# Patient Record
Sex: Female | Born: 1980 | Race: White | Hispanic: No
Health system: Southern US, Community
[De-identification: ages and names within clinical notes are randomized; demographics above are authoritative.]

---

## 2005-07-15 ENCOUNTER — Other Ambulatory Visit: Admission: RE | Admit: 2005-07-15 | Discharge: 2005-07-15 | Payer: Self-pay | Admitting: Obstetrics and Gynecology

## 2006-03-29 ENCOUNTER — Other Ambulatory Visit: Admission: RE | Admit: 2006-03-29 | Discharge: 2006-03-29 | Payer: Self-pay | Admitting: Obstetrics & Gynecology

## 2006-05-20 ENCOUNTER — Other Ambulatory Visit: Admission: RE | Admit: 2006-05-20 | Discharge: 2006-05-20 | Payer: Self-pay | Admitting: Obstetrics & Gynecology

## 2009-02-25 ENCOUNTER — Emergency Department (HOSPITAL_COMMUNITY): Admission: EM | Admit: 2009-02-25 | Discharge: 2009-02-25 | Payer: Self-pay | Admitting: Emergency Medicine

## 2009-11-30 ENCOUNTER — Emergency Department (HOSPITAL_COMMUNITY): Admission: EM | Admit: 2009-11-30 | Discharge: 2009-11-30 | Payer: Self-pay | Admitting: Emergency Medicine

## 2011-01-13 LAB — URINALYSIS, ROUTINE W REFLEX MICROSCOPIC
Bilirubin Urine: NEGATIVE
Ketones, ur: NEGATIVE mg/dL
Specific Gravity, Urine: 1.016 (ref 1.005–1.030)
Urobilinogen, UA: 2 mg/dL — ABNORMAL HIGH (ref 0.0–1.0)
pH: 7.5 (ref 5.0–8.0)

## 2011-01-13 LAB — COMPREHENSIVE METABOLIC PANEL
ALT: 258 U/L — ABNORMAL HIGH (ref 0–35)
Albumin: 4.1 g/dL (ref 3.5–5.2)
Alkaline Phosphatase: 101 U/L (ref 39–117)
Calcium: 9.9 mg/dL (ref 8.4–10.5)
Chloride: 103 mEq/L (ref 96–112)
Creatinine, Ser: 0.7 mg/dL (ref 0.4–1.2)
Potassium: 3.8 mEq/L (ref 3.5–5.1)
Total Protein: 7.4 g/dL (ref 6.0–8.3)

## 2011-01-13 LAB — CBC
Hemoglobin: 14.5 g/dL (ref 12.0–15.0)
MCHC: 34.5 g/dL (ref 30.0–36.0)
Platelets: 259 10*3/uL (ref 150–400)
RBC: 4.7 MIL/uL (ref 3.87–5.11)
WBC: 11.6 10*3/uL — ABNORMAL HIGH (ref 4.0–10.5)

## 2011-01-13 LAB — DIFFERENTIAL
Basophils Absolute: 0.1 10*3/uL (ref 0.0–0.1)
Monocytes Relative: 7 % (ref 3–12)

## 2011-02-02 LAB — CBC
Hemoglobin: 14.9 g/dL (ref 12.0–15.0)
MCHC: 35.2 g/dL (ref 30.0–36.0)
MCV: 87.6 fL (ref 78.0–100.0)
RDW: 13.2 % (ref 11.5–15.5)

## 2011-02-02 LAB — URINALYSIS, ROUTINE W REFLEX MICROSCOPIC
Bilirubin Urine: NEGATIVE
Ketones, ur: NEGATIVE mg/dL
Nitrite: NEGATIVE
Urobilinogen, UA: 1 mg/dL (ref 0.0–1.0)

## 2011-02-02 LAB — DIFFERENTIAL
Eosinophils Relative: 1 % (ref 0–5)
Lymphocytes Relative: 27 % (ref 12–46)
Lymphs Abs: 2.5 10*3/uL (ref 0.7–4.0)
Monocytes Relative: 5 % (ref 3–12)
Neutro Abs: 5.9 10*3/uL (ref 1.7–7.7)
Neutrophils Relative %: 66 % (ref 43–77)

## 2011-02-02 LAB — POCT I-STAT, CHEM 8
Chloride: 109 mEq/L (ref 96–112)
Hemoglobin: 14.6 g/dL (ref 12.0–15.0)
Potassium: 3.9 mEq/L (ref 3.5–5.1)
Sodium: 140 mEq/L (ref 135–145)

## 2011-09-19 IMAGING — US US ABDOMEN COMPLETE
1 series · 14 of 25 positions shown · non-contrast
Comparison: None.

CLINICAL DATA: Abdominal pain and nausea

COMPLETE ABDOMINAL ULTRASOUND

[Series 1: us abdomen complete · 0.32mm/px · 14 of 57 slices shown]
[im 1/57]
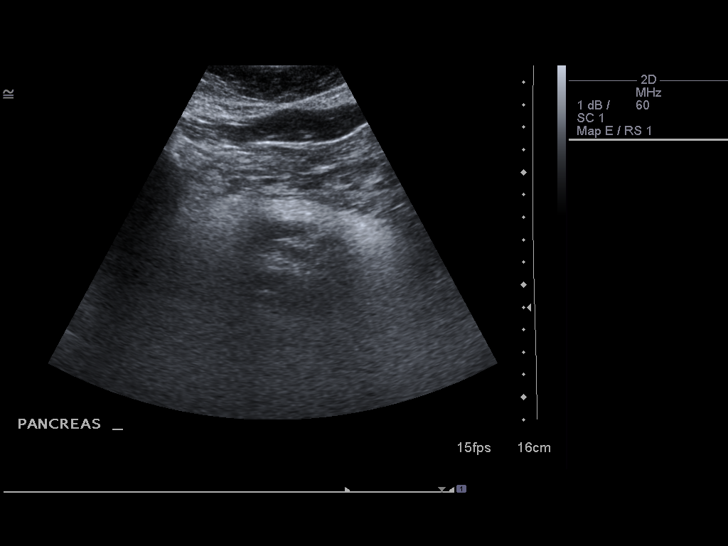
[im 5/57]
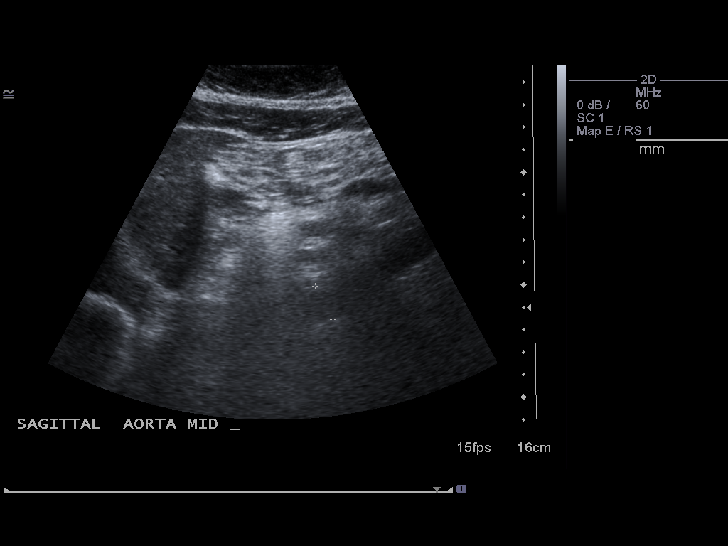
[im 10/57]
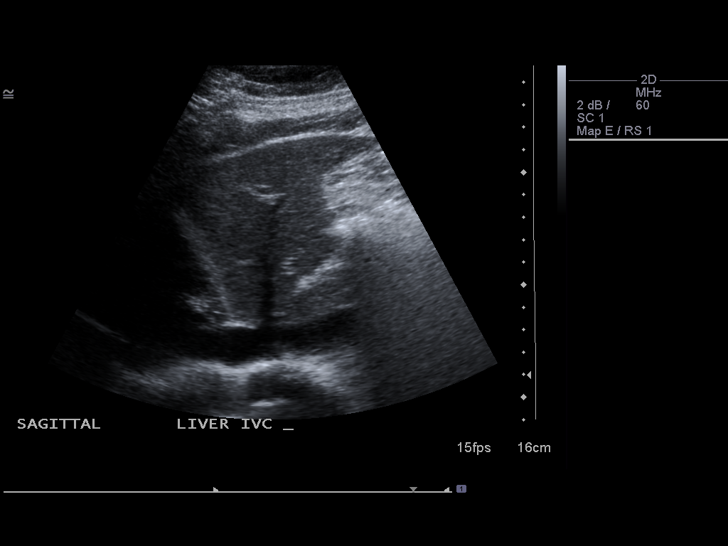
[im 15/57]
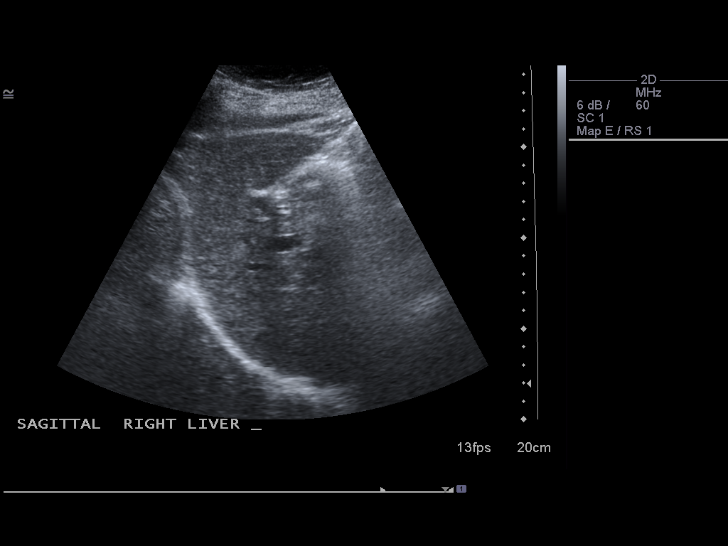
[im 19/57]
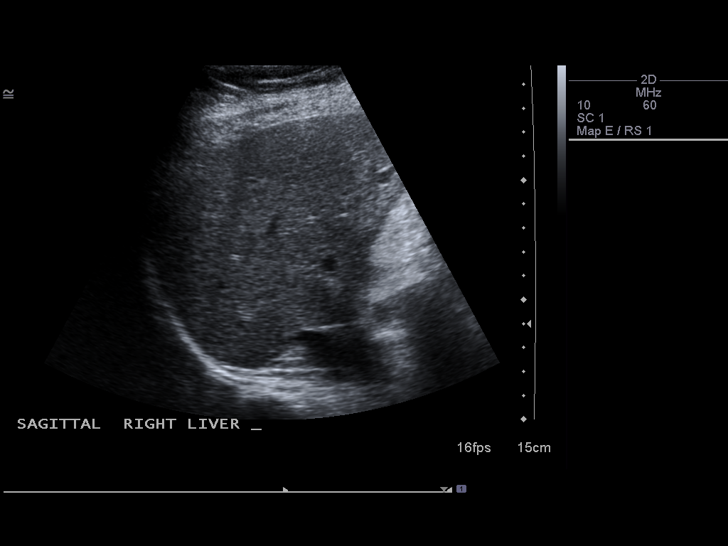
[im 22/57]
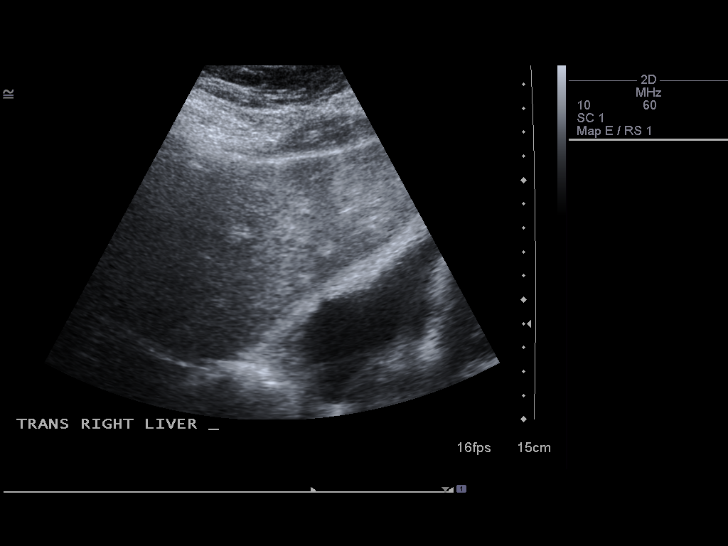
[im 26/57]
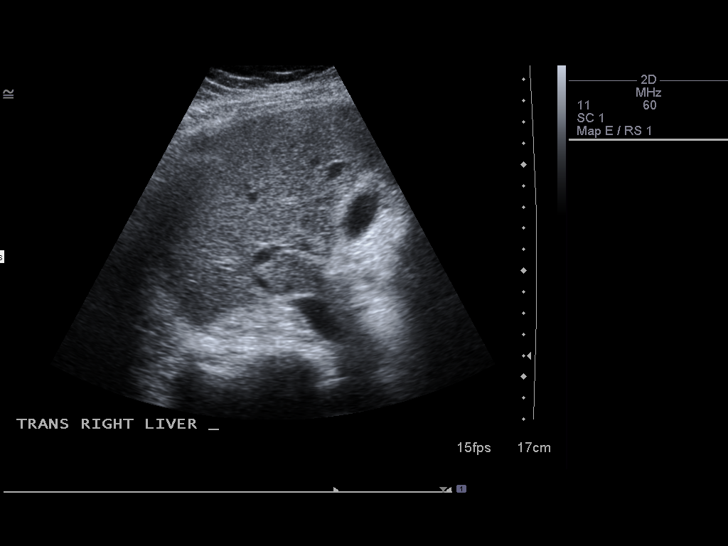
[im 31/57]
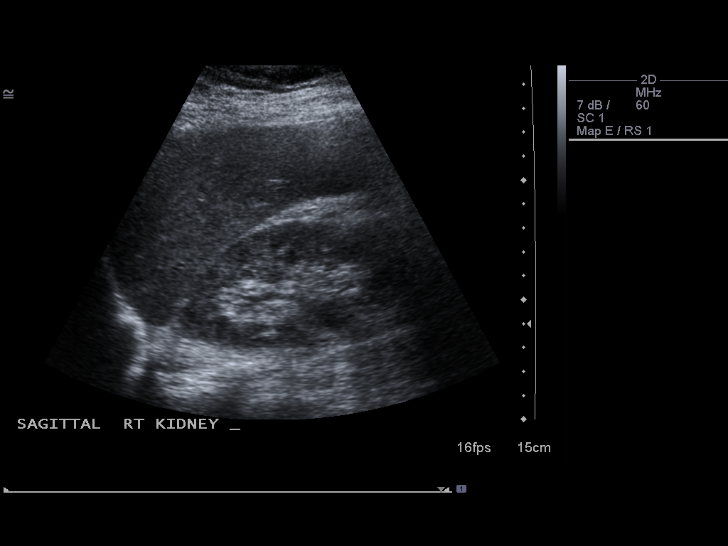
[im 36/57]
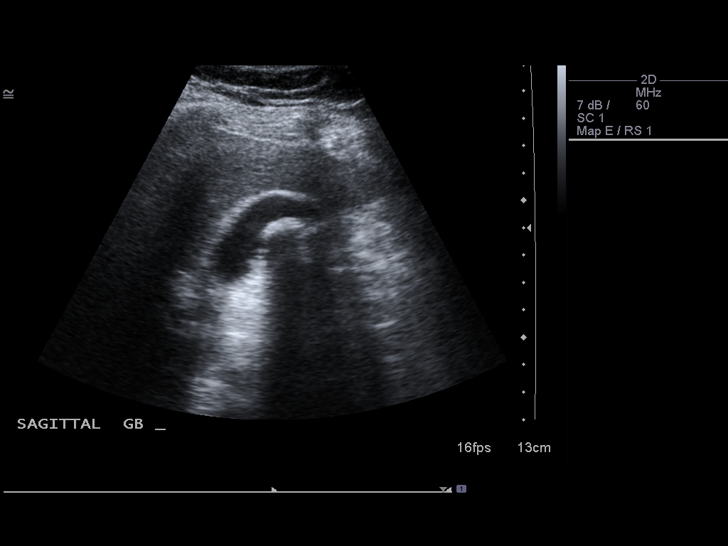
[im 38/57]
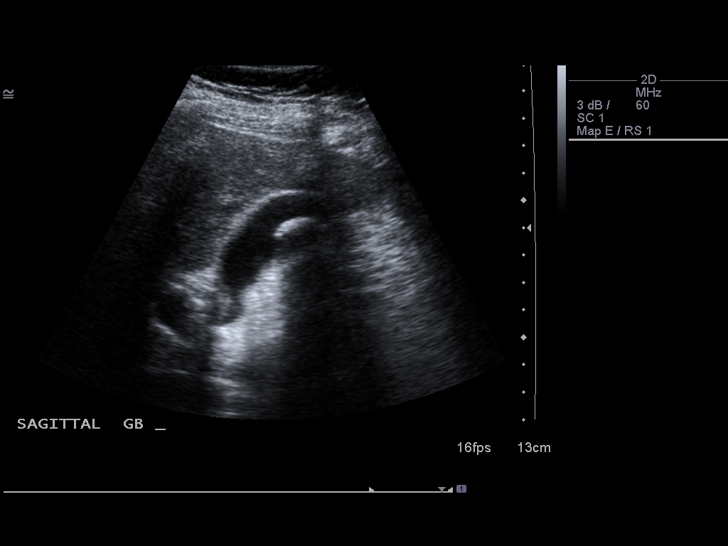
[im 43/57]
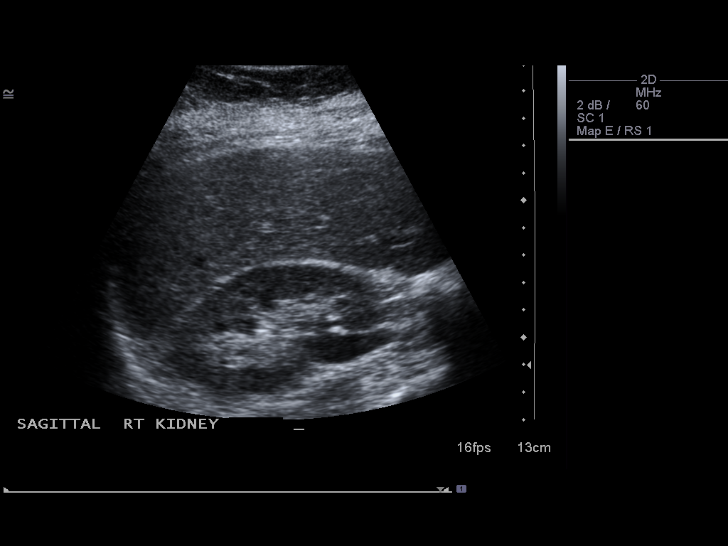
[im 47/57]
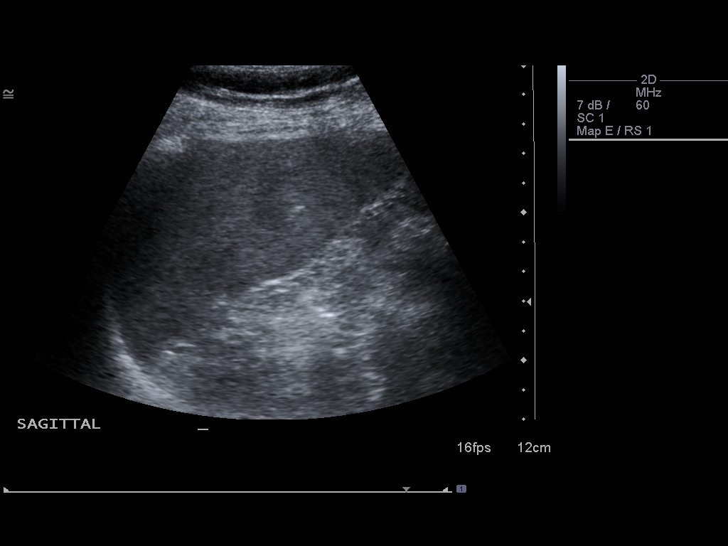
[im 52/57]
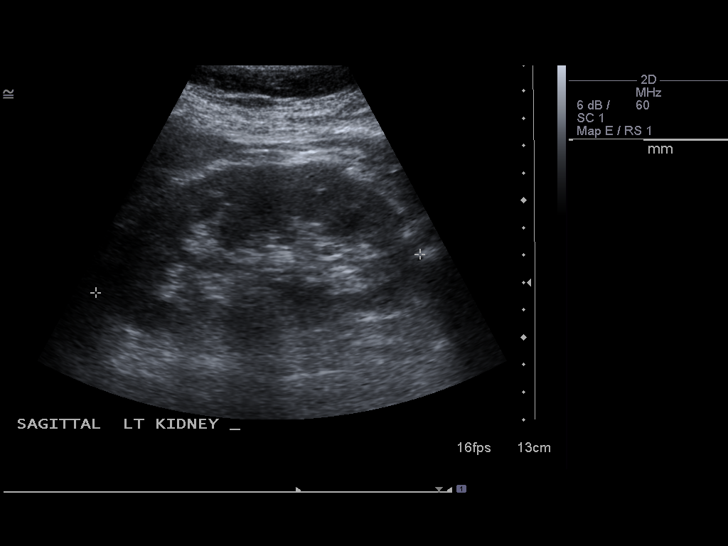
[im 57/57]
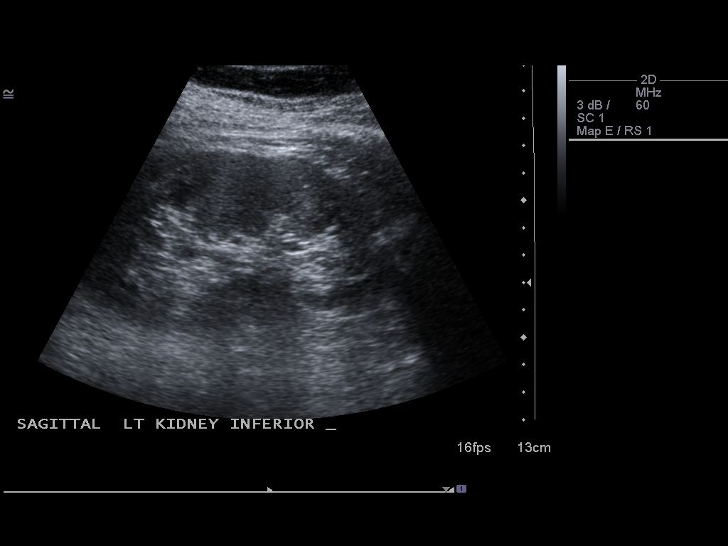

[14 of 25 positions shown; findings below may reference images not displayed]

FINDINGS: Gallbladder:  There are multiple stones in the gallbladder with
slight thickening of the gallbladder wall.  There is no
pericholecystic fluid or gallbladder distention.

Common bile duct:  Normal 5.3 mm maximal diameter.

Liver:  No focal lesion identified.  Within normal limits in
parenchymal echogenicity.

IVC:  Appears normal.

Pancreas:  Normal.

Right Kidney:  The kidney is normal and measures 10.9 cm in length.
There is slight prominence of the right renal pelvis, nonspecific.

Left Kidney:  Normal.  11.9 cm in length.

Abdominal aorta:  Normal.
IMPRESSION: Multiple gallstones with slight thickening of the gallbladder wall.

Slight prominence of the right renal pelvis without significant
hydronephrosis.

## 2019-05-23 ENCOUNTER — Emergency Department (HOSPITAL_COMMUNITY): Payer: BLUE CROSS/BLUE SHIELD

## 2019-05-23 ENCOUNTER — Emergency Department (HOSPITAL_COMMUNITY)
Admission: EM | Admit: 2019-05-23 | Discharge: 2019-05-24 | Disposition: A | Discharge status: Home / Self Care | Payer: BLUE CROSS/BLUE SHIELD | Attending: Emergency Medicine | Admitting: Emergency Medicine

## 2019-05-23 ENCOUNTER — Other Ambulatory Visit: Payer: Self-pay

## 2019-05-23 DIAGNOSIS — R0789 Other chest pain: Secondary | ICD-10-CM | POA: Insufficient documentation

## 2019-05-23 DIAGNOSIS — R079 Chest pain, unspecified: Secondary | ICD-10-CM

## 2019-05-23 LAB — I-STAT BETA HCG BLOOD, ED (MC, WL, AP ONLY): I-stat hCG, quantitative: <5 m[IU]/mL (ref <5)

## 2019-05-23 LAB — BASIC METABOLIC PANEL
Anion gap: 11 (ref 5–15)
BUN: 10 mg/dL (ref 6–20)
CO2: 21 mmol/L — ABNORMAL LOW (ref 22–32)
Calcium: 9.3 mg/dL (ref 8.9–10.3)
Chloride: 107 mmol/L (ref 98–111)
Creatinine, Ser: 0.79 mg/dL (ref 0.44–1.00)
GFR calc Af Amer: >60 mL/min (ref >60)
GFR calc non Af Amer: >60 mL/min (ref >60)
Glucose, Bld: 91 mg/dL (ref 70–99)
Potassium: 4.1 mmol/L (ref 3.5–5.1)
Sodium: 139 mmol/L (ref 135–145)

## 2019-05-23 LAB — CBC
HCT: 45.8 % (ref 36.0–46.0)
Hemoglobin: 15.6 g/dL — ABNORMAL HIGH (ref 12.0–15.0)
MCH: 30.7 pg (ref 26.0–34.0)
MCHC: 34.1 g/dL (ref 30.0–36.0)
MCV: 90.2 fL (ref 80.0–100.0)
Platelets: 282 10*3/uL (ref 150–400)
RBC: 5.08 MIL/uL (ref 3.87–5.11)
RDW: 12.4 % (ref 11.5–15.5)
WBC: 9.2 10*3/uL (ref 4.0–10.5)
nRBC: 0 % (ref 0.0–0.2)

## 2019-05-23 LAB — TROPONIN I (HIGH SENSITIVITY): Troponin I (High Sensitivity): 3 ng/L (ref <18)

## 2019-05-23 MED ORDER — SODIUM CHLORIDE 0.9% FLUSH: 3.0000 mL | Freq: Once | INTRAVENOUS | Status: DC

## 2019-05-23 NOTE — ED Triage Notes (Signed)
Pt states around 3 pm she started having center cp after eating. 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 Pt denies and sob. Pt did benadryl and Pepcid

## 2019-05-24 LAB — TROPONIN I (HIGH SENSITIVITY): Troponin I (High Sensitivity): <2 ng/L (ref <18)

## 2019-05-24 NOTE — ED Provider Notes (Signed)
[facility]   CSN: [id number] Arrival date & time: 05/23/19  1757    History   Chief Complaint Chief Complaint  Patient presents with  . Chest Pain    HPI Itxel D Paige Cannon is a 38 y.o. female with a hx of Alpha-gal syndrome presents to the Emergency Department complaining of gradual, improving central/left sided chest pain onset around 3pm, approx 2.5 hours after eating a commercially prepared "vegan" dinner.  Patient reports her chest pain was worse with deep inspiration, movement and palpation.  Lying down did improve her pain some.  Pt reports she had no associated symptoms.  Specifically patient denied shortness of breath, diaphoresis, wheezing, abdominal pain, nausea, vomiting, weakness, lightheadedness, syncope, palpitations, tachycardia.  Patient denies known ingestion of meat products or lactose.  She reports previous reactions have never included chest pain.  No treatments prior to arrival.  Patient also denies history of DVT, lupus, oral contraceptive, periods of immobilization or travel, leg swelling.  Patient denies personal cardiac history.  Patient did report spending yesterday and some of today cleaning her son's room which is more activity than normal for her however she denies specific lifting, pulling or injuries.     The history is provided by the patient and medical records. No language interpreter was used.    No past medical history on file.  There are no active problems to display for this patient.    OB History   No obstetric history on file.      Home Medications    Prior to Admission medications   Medication Sig Start Date End Date Taking? Authorizing Provider  albuterol (VENTOLIN HFA) 108 (90 Base) MCG/ACT inhaler Inhale 1-2 puffs into the lungs every 6 (six) hours as needed for shortness of breath. 07/23/17  Yes [provider]  EPINEPHrine (AUVI-Q) 0.3 mg/0.3 mL IJ SOAJ injection Inject 0.3  mg into the muscle daily as needed for anaphylaxis. 05/14/19  Yes [provider]  hydrOXYzine (ATARAX/VISTARIL) 25 MG tablet Take 25 mg by mouth 3 (three) times daily as needed for anxiety. 05/11/19  Yes [provider]  sertraline (ZOLOFT) 100 MG tablet Take 150 mg by mouth every morning. 04/21/19  Yes [provider]    Family History No family history on file.  Social History Social History   Tobacco Use  . Smoking status: Not on file  Substance Use Topics  . Alcohol use: Not on file  . Drug use: Not on file     Allergies   Meat [alpha-gal], Codeine, and Lac bovis   Review of Systems Review of Systems  Constitutional: Negative for appetite change, diaphoresis, fatigue, fever and unexpected weight change.  HENT: Negative for mouth sores.   Eyes: Negative for visual disturbance.  Respiratory: Negative for cough, chest tightness, shortness of breath and wheezing.   Cardiovascular: Positive for chest pain.  Gastrointestinal: Negative for abdominal pain, constipation, diarrhea, nausea and vomiting.  Endocrine: Negative for polydipsia, polyphagia and polyuria.  Genitourinary: Negative for dysuria, frequency, hematuria and urgency.  Musculoskeletal: Negative for back pain and neck stiffness.  Skin: Negative for rash.  Allergic/Immunologic: Negative for immunocompromised state.  Neurological: Negative for syncope, light-headedness and headaches.  Hematological: Does not bruise/bleed easily.  Psychiatric/Behavioral: Negative for sleep disturbance. The patient is not nervous/anxious.      Physical Exam Updated Vital Signs BP 117/70   Pulse 65   Temp 98.3 F (36.8 C) (Oral)   Resp 19   LMP  05/04/2019   SpO2 100%   Physical Exam Vitals signs and nursing note reviewed.  Constitutional:      General: She is not in acute distress.    Appearance: She is not diaphoretic.  HENT:     Head: Normocephalic.  Eyes:     General: No scleral icterus.     Conjunctiva/sclera: Conjunctivae normal.  Neck:     Musculoskeletal: Normal range of motion.  Cardiovascular:     Rate and Rhythm: Normal rate and regular rhythm.     Pulses: Normal pulses.          Radial pulses are 2+ on the right side and 2+ on the left side.  Pulmonary:     Effort: No tachypnea, accessory muscle usage, prolonged expiration, respiratory distress or retractions.     Breath sounds: No stridor.     Comments: Equal chest rise. No increased work of breathing. Chest:     Chest wall: Tenderness present.       Comments: No palpable masses in left or right breast. No crepitus No flail segment Abdominal:     General: There is no distension.     Palpations: Abdomen is soft.     Tenderness: There is no abdominal tenderness. There is no guarding or rebound.  Musculoskeletal:     Comments: Moves all extremities equally and without difficulty.  Skin:    General: Skin is warm and dry.     Capillary Refill: Capillary refill takes less than 2 seconds.  Neurological:     Mental Status: She is alert.     GCS: GCS eye subscore is 4. GCS verbal subscore is 5. GCS motor subscore is 6.     Comments: Speech is clear and goal oriented.  Psychiatric:        Mood and Affect: Mood normal.      ED Treatments / Results  Labs (all labs ordered are listed, but only abnormal results are displayed) Labs Reviewed  BASIC METABOLIC PANEL - Abnormal; Notable for the following components:      Result Value   CO2 21 (*)    All other components within normal limits  CBC - Abnormal; Notable for the following components:   Hemoglobin 15.6 (*)    All other components within normal limits  I-STAT BETA HCG BLOOD, ED ([facility], [facility], [facility] ONLY)  TROPONIN I (HIGH SENSITIVITY)  TROPONIN I (HIGH SENSITIVITY)    EKG EKG Interpretation  Date/Time:  Wednesday May 23 2019 18:07:25 EDT Ventricular Rate:  96 PR Interval:  122 QRS Duration: 78 QT Interval:  358 QTC Calculation: 452 R Axis:   81 Text  Interpretation:  Normal sinus rhythm with sinus arrhythmia Normal ECG No old tracing to compare Confirmed by Merrily Pew [phone]) on 05/24/2019 2:22:01 AM   Radiology Dg Chest 2 View  Result Date: 05/23/2019 CLINICAL DATA:  Chest pain. EXAM: CHEST - 2 VIEW COMPARISON:  None. FINDINGS: The cardiomediastinal contours are normal. The lungs are clear. Pulmonary vasculature is normal. No consolidation, pleural effusion, or pneumothorax. No acute osseous abnormalities are seen. IMPRESSION: Unremarkable radiographs of the chest. Electronically Signed   By: Keith Rake M.D.   On: 05/23/2019 19:07    Procedures Procedures (including critical care time)  Medications Ordered in ED Medications  sodium chloride flush (NS) 0.9 % injection 3 mL (has no administration in time range)     Initial Impression / Assessment and Plan / ED Course  I have reviewed the triage vital signs and the  nursing notes.  Pertinent labs & imaging results that were available during my care of the patient were reviewed by me and considered in my medical decision making (see chart for details).        Patient presents with chest pain.  No personal cardiac hx.  PERC negative, VSS, no tracheal deviation, no JVD or new murmur, RRR, EKG without acute abnormalities, negative troponin, and negative CXR.  No known COVID exposures.  No COVID-like symptoms.  Patient is without shortness of breath.  Patient does have a history of alpha gal syndrome however there is no evidence of allergic reaction tonight.  Symptoms may be from possible small ingestion, but no evidence of anaphylaxis.  Chest wall pain is also a possibility.  Chest pain less likely to be cardiac given reassuring work-up here in the emergency department.  Patient is already taking daily Pepcid and Zyrtec daily.  Recommended adding anti-inflammatory if pain persists.  Pt has been advised to return to the ED if CP becomes exertional, associated with diaphoresis or  nausea, radiates to left jaw/arm, worsens or becomes concerning in any way.  She is to have close primary care follow-up.  Pt appears reliable for follow up and is agreeable to discharge.     Final Clinical Impressions(s) / ED Diagnoses   Final diagnoses:  Central chest pain    ED Discharge Orders    None       Mardene SayerMuthersbaugh, Boyd KerbsHannah, PA-C 05/24/19 0230    Mesner, Barbara CowerJason, MD 05/25/19 [phone]

## 2019-05-24 NOTE — Discharge Instructions (Addendum)
1. Medications: antiinflammatories, usual home medications 2. Treatment: rest, drink plenty of fluids,  3. Follow Up: Please followup with your primary doctor in 2 days for discussion of your diagnoses and further evaluation after today's visit; if you do not have a primary care doctor use the resource guide provided to find one; Please return to the ER for new or worsening symptoms including difficulty breathing, worsening pain, lightheadedness or other concerns.

## 2021-03-11 IMAGING — DX CHEST - 2 VIEW
2 series · 2 of 2 positions shown · non-contrast
Comparison: None.

CLINICAL DATA: Chest pain.

EXAM:
CHEST - 2 VIEW

[w chest pa]
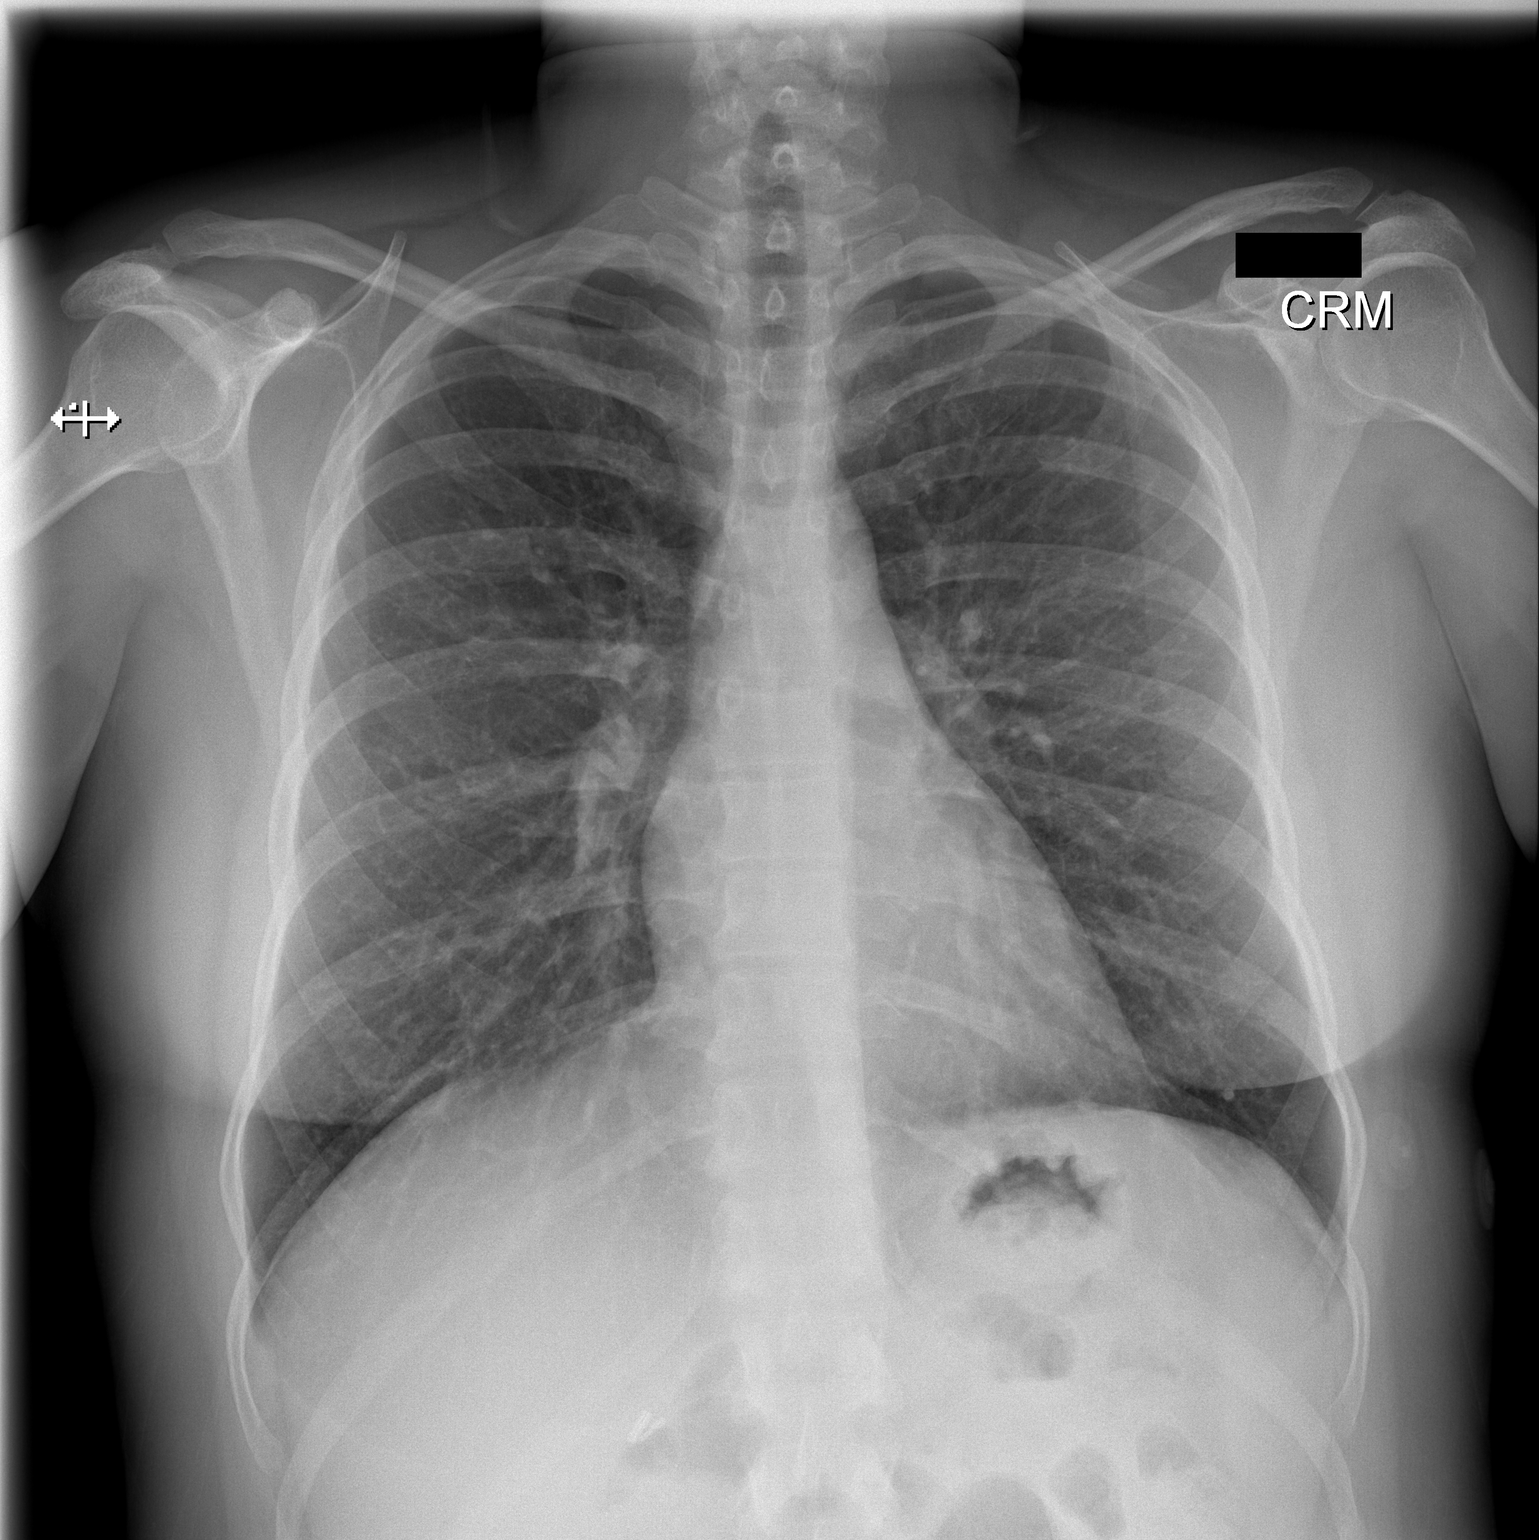

[w chest lat]
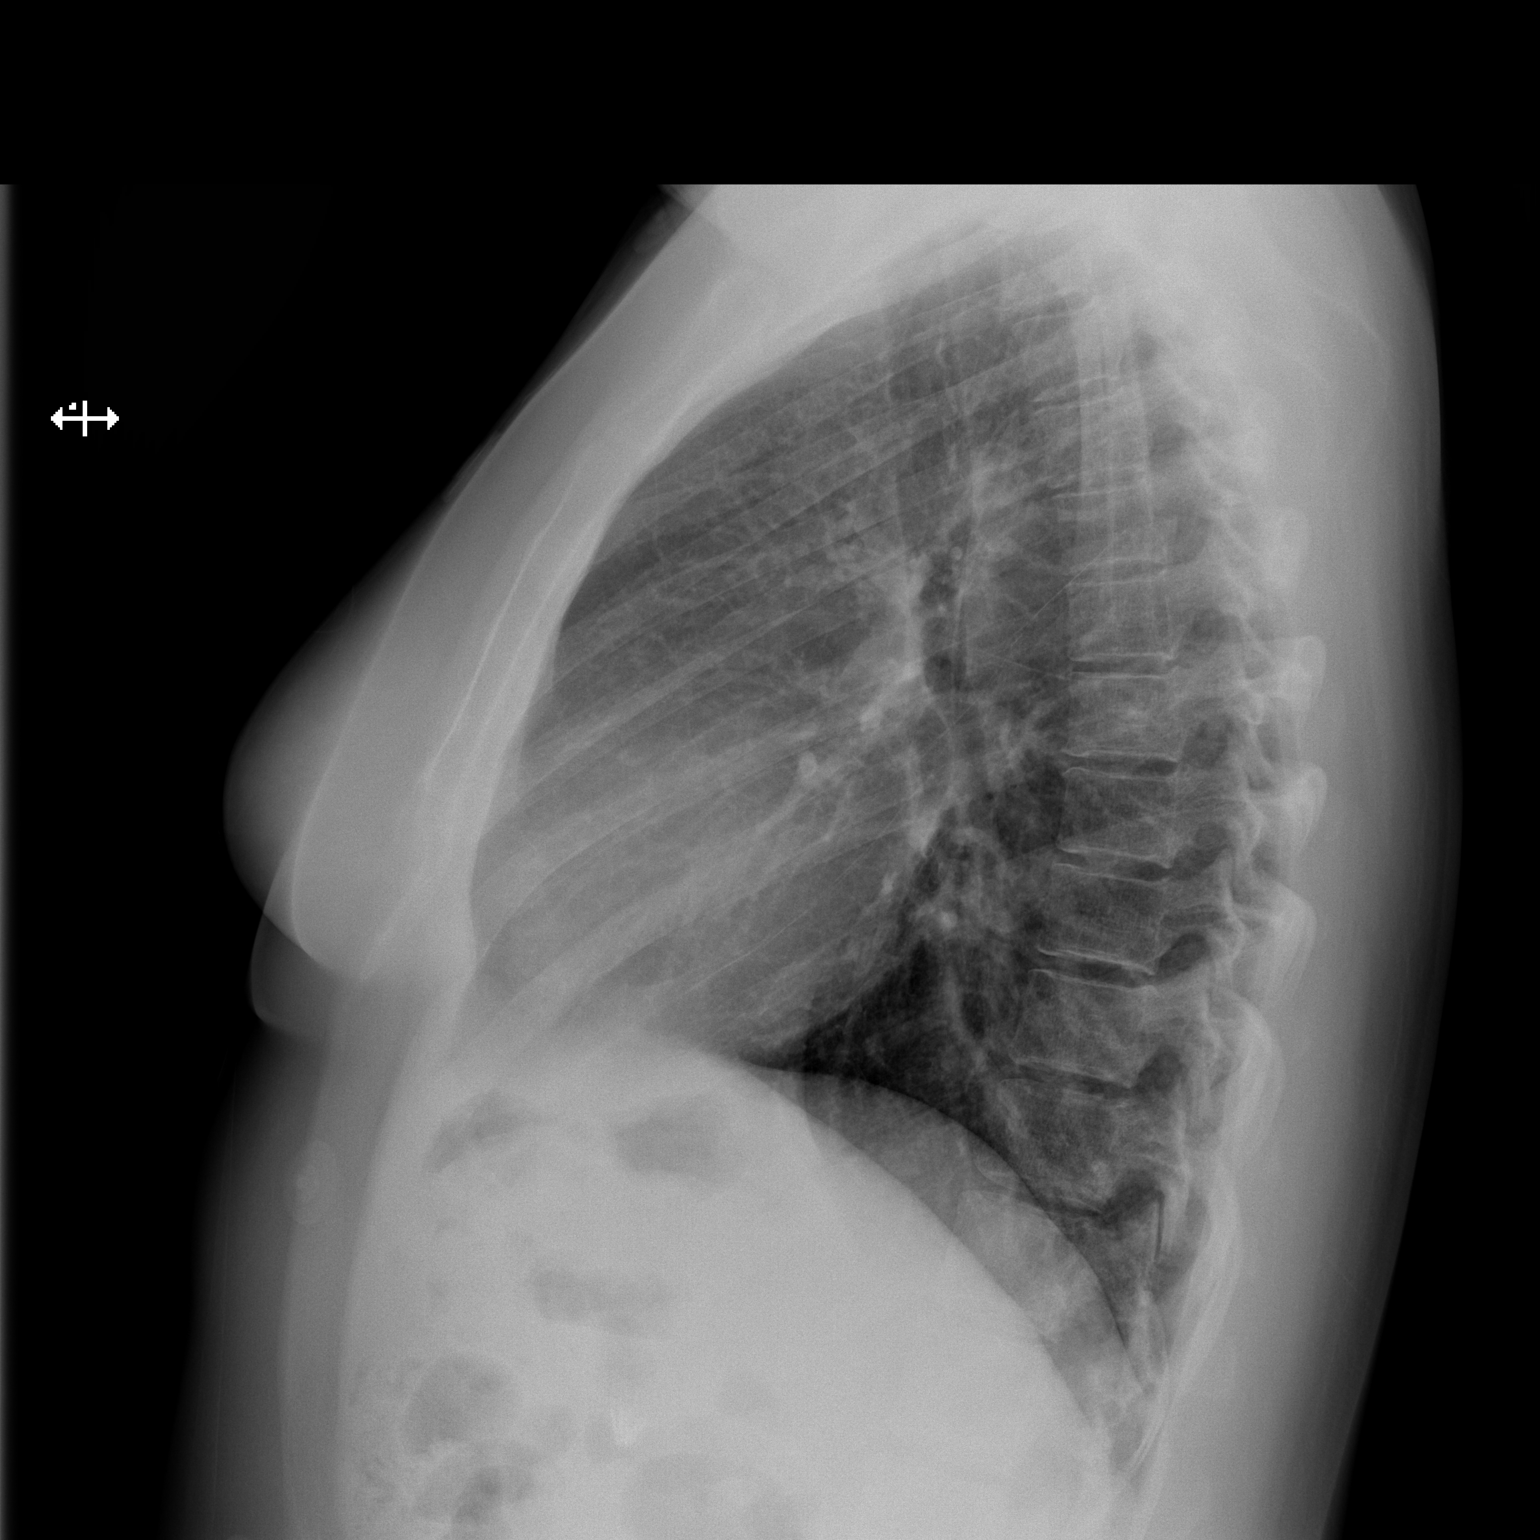

[2 of 2 positions shown; findings below may reference images not displayed]

FINDINGS: The cardiomediastinal contours are normal. The lungs are clear.
Pulmonary vasculature is normal. No consolidation, pleural effusion,
or pneumothorax. No acute osseous abnormalities are seen.
IMPRESSION: Unremarkable radiographs of the chest.
